# Patient Record
Sex: Female | Born: 1975 | Race: Black or African American | Hispanic: No | Marital: Married | Smoking: Never smoker
Health system: Southern US, Community
[De-identification: ages and names within clinical notes are randomized; demographics above are authoritative.]

## PROBLEM LIST (undated history)

## (undated) DIAGNOSIS — E119 Type 2 diabetes mellitus without complications: Secondary | ICD-10-CM

## (undated) DIAGNOSIS — I10 Essential (primary) hypertension: Secondary | ICD-10-CM

---

## 2013-04-15 DIAGNOSIS — I10 Essential (primary) hypertension: Secondary | ICD-10-CM | POA: Insufficient documentation

## 2013-10-10 DIAGNOSIS — R7303 Prediabetes: Secondary | ICD-10-CM | POA: Insufficient documentation

## 2013-11-30 DIAGNOSIS — K589 Irritable bowel syndrome without diarrhea: Secondary | ICD-10-CM | POA: Insufficient documentation

## 2013-11-30 DIAGNOSIS — K5909 Other constipation: Secondary | ICD-10-CM | POA: Insufficient documentation

## 2016-11-03 ENCOUNTER — Encounter (HOSPITAL_BASED_OUTPATIENT_CLINIC_OR_DEPARTMENT_OTHER): Payer: Self-pay | Admitting: *Deleted

## 2016-11-03 ENCOUNTER — Emergency Department (HOSPITAL_BASED_OUTPATIENT_CLINIC_OR_DEPARTMENT_OTHER): Payer: BLUE CROSS/BLUE SHIELD

## 2016-11-03 ENCOUNTER — Emergency Department (HOSPITAL_BASED_OUTPATIENT_CLINIC_OR_DEPARTMENT_OTHER)
Admission: EM | Admit: 2016-11-03 | Discharge: 2016-11-03 | Disposition: A | Discharge status: Home / Self Care | Payer: BLUE CROSS/BLUE SHIELD | Attending: Emergency Medicine | Admitting: Emergency Medicine

## 2016-11-03 DIAGNOSIS — R079 Chest pain, unspecified: Secondary | ICD-10-CM | POA: Insufficient documentation

## 2016-11-03 DIAGNOSIS — I10 Essential (primary) hypertension: Secondary | ICD-10-CM | POA: Diagnosis not present

## 2016-11-03 DIAGNOSIS — R200 Anesthesia of skin: Secondary | ICD-10-CM | POA: Diagnosis present

## 2016-11-03 HISTORY — DX: Essential (primary) hypertension: I10

## 2016-11-03 LAB — CBC
HEMATOCRIT: 37.9 % (ref 36.0–46.0)
Hemoglobin: 12.7 g/dL (ref 12.0–15.0)
MCH: 27.3 pg (ref 26.0–34.0)
MCHC: 33.5 g/dL (ref 30.0–36.0)
MCV: 81.3 fL (ref 78.0–100.0)
PLATELETS: 319 10*3/uL (ref 150–400)
RBC: 4.66 MIL/uL (ref 3.87–5.11)
RDW: 15.2 % (ref 11.5–15.5)
WBC: 8.5 10*3/uL (ref 4.0–10.5)

## 2016-11-03 LAB — BASIC METABOLIC PANEL
Anion gap: 8 (ref 5–15)
BUN: 9 mg/dL (ref 6–20)
CHLORIDE: 102 mmol/L (ref 101–111)
CO2: 25 mmol/L (ref 22–32)
CREATININE: 0.84 mg/dL (ref 0.44–1.00)
Calcium: 8.9 mg/dL (ref 8.9–10.3)
GFR calc Af Amer: >60 mL/min (ref >60)
GFR calc non Af Amer: >60 mL/min (ref >60)
GLUCOSE: 164 mg/dL — AB (ref 65–99)
POTASSIUM: 3.3 mmol/L — AB (ref 3.5–5.1)
SODIUM: 135 mmol/L (ref 135–145)

## 2016-11-03 LAB — TROPONIN I: Troponin I: <0.03 ng/mL (ref <0.03)

## 2016-11-03 NOTE — Discharge Instructions (Signed)
Follow up with your family doc.  °

## 2016-11-03 NOTE — ED Provider Notes (Signed)
[facility] Provider Note   CSN: [id number] Arrival date & time: 11/03/16  2121  By signing my name below, I, Vista Minkobert Ross, attest that this documentation has been prepared under the direction and in the presence of Melene Planan Revere Maahs, DO. Electronically signed, Vista Minkobert Ross, ED Scribe. 11/03/16. 9:55 PM.  History   Chief Complaint No chief complaint on file.   HPI HPI Comments: Rhonda Camacho is a 40 y.o. female, with Hx of HTN, who presents to the Emergency Department complaining of gradually improved chest pain with associated intermittent numbness to her left great toe, onset two weeks ago. Pt states she started to have chest pain two weeks ago, she described this chest pain as "it feels like gas is in my chest". Her chest pain subsided after one week and has not had an episode of chest pain since. She also has had intermittent numbness to left great toe at night. Pt reports that her BP has been elevated for the past 3 days but is not taking medication for this. BP was 223/115 on arrival. No exacerbating or alleviating factors. No shortness of breath or diaphoresis.   The history is provided by the patient. No language interpreter was used.    Past Medical History:  Diagnosis Date  . Hypertension     There are no active problems to display for this patient.   Past Surgical History:  Procedure Laterality Date  . CESAREAN SECTION      OB History    No data available       Home Medications    Prior to Admission medications   Medication Sig Start Date End Date Taking? Authorizing Provider  valsartan (DIOVAN) 40 MG tablet Take 40 mg by mouth daily.   Yes Historical Provider, MD    Family History No family history on file.  Social History Social History  Substance Use Topics  . Smoking status: Never Smoker  . Smokeless tobacco: Never Used  . Alcohol use No    Allergies   Patient has no known allergies.   Review of Systems Review of Systems    Constitutional: Negative for chills and fever.  HENT: Negative for congestion and rhinorrhea.   Eyes: Negative for redness and visual disturbance.  Respiratory: Negative for shortness of breath and wheezing.   Cardiovascular: Positive for chest pain. Negative for palpitations.  Gastrointestinal: Negative for constipation, nausea and vomiting.  Genitourinary: Negative for dysuria and urgency.  Musculoskeletal: Negative for arthralgias and myalgias.  Skin: Negative for pallor and wound.  Neurological: Positive for numbness. Negative for dizziness and headaches.  All other systems reviewed and are negative.   Physical Exam Updated Vital Signs BP (!) 223/115   Pulse 87   Temp 99.3 F (37.4 C) (Oral)   Resp 24   Ht 5\' 3"  (1.6 m)   Wt 192 lb (87.1 kg)   SpO2 100%   BMI 34.01 kg/m   Physical Exam  Constitutional: She is oriented to person, place, and time. She appears well-developed and well-nourished. No distress.  HENT:  Head: Normocephalic and atraumatic.  Eyes: EOM are normal. Pupils are equal, round, and reactive to light.  Neck: Normal range of motion. Neck supple.  Cardiovascular: Normal rate and regular rhythm.  Exam reveals no gallop and no friction rub.   No murmur heard. Pulmonary/Chest: Effort normal. She has no wheezes. She has no rales.  Abdominal: Soft. She exhibits no distension. There is no tenderness.  Musculoskeletal: She exhibits no edema or tenderness.  Neurological: She is alert and oriented to person, place, and time. No cranial nerve deficit or sensory deficit. Coordination normal.  Normal neuro exam. Coordination was deferred in LUE due to discomfort with IV.  Skin: Skin is warm and dry. She is not diaphoretic.  Psychiatric: She has a normal mood and affect. Her behavior is normal.  Nursing note and vitals reviewed.    ED Treatments / Results  DIAGNOSTIC STUDIES: Oxygen Saturation is 100% on RA, normal by my interpretation.  COORDINATION OF  CARE: 9:50 PM-Discussed treatment plan with pt at bedside and pt agreed to plan.   Labs (all labs ordered are listed, but only abnormal results are displayed) Labs Reviewed  BASIC METABOLIC PANEL  CBC  TROPONIN I   EKG  EKG Interpretation  Date/Time:  Monday November 03 2016 21:34:23 EST Ventricular Rate:  75 PR Interval:  138 QRS Duration: 82 QT Interval:  358 QTC Calculation: 399 R Axis:   74 Text Interpretation:  Normal sinus rhythm T wave abnormality, consider inferior ischemia Abnormal ECG No old tracing to compare Confirmed by Terrina Docter MD, DANIEL [phone]([id number]) on 11/03/2016 9:45:06 PM       Radiology No results found.  Procedures Procedures (including critical care time)  Medications Ordered in ED Medications - No data to display   Initial Impression / Assessment and Plan / ED Course  I have reviewed the triage vital signs and the nursing notes.  Pertinent labs & imaging results that were available during my care of the patient were reviewed by me and considered in my medical decision making (see chart for details).  Clinical Course     40 yo F With a chief complaint of high blood pressure. She has had no real symptoms with this. She states that she has had some off and on paresthesias to her left great toe. These seem to come and go without any real association. Denies any other neurologic deficits. She has had some mild headaches off and on but this been going on for many months. A week or so ago she did have some chest pain and shortness of breath that resolved spontaneously. Labs were ordered by the triage nurse and were unremarkable. Discharge home.  11:24 PM:  I have discussed the diagnosis/risks/treatment options with the patient and family and believe the pt to be eligible for discharge home to follow-up with PCP. We also discussed returning to the ED immediately if new or worsening sx occur. We discussed the sx which are most concerning (e.g., sudden worsening pain,  fever, inability to tolerate by mouth) that necessitate immediate return. Medications administered to the patient during their visit and any new prescriptions provided to the patient are listed below.  Medications given during this visit Medications - No data to display   The patient appears reasonably screen and/or stabilized for discharge and I doubt any other medical condition or other [facility] requiring further screening, evaluation, or treatment in the ED at this time prior to discharge.    Final Clinical Impressions(s) / ED Diagnoses   Final diagnoses:  None    New Prescriptions New Prescriptions   No medications on file  I personally performed the services described in this documentation, which was scribed in my presence. The recorded information has been reviewed and is accurate.     Melene Planan Walther [place], DO 11/03/16 2324

## 2016-11-03 NOTE — ED Triage Notes (Signed)
Elevated BP x 3 days that she is aware of. Left great toe has been numb on and off x 2 weeks. Constipation.

## 2016-11-06 DIAGNOSIS — Q600 Renal agenesis, unilateral: Secondary | ICD-10-CM | POA: Insufficient documentation

## 2016-11-23 DIAGNOSIS — E669 Obesity, unspecified: Secondary | ICD-10-CM | POA: Insufficient documentation

## 2017-11-27 ENCOUNTER — Other Ambulatory Visit: Payer: Self-pay

## 2017-11-27 ENCOUNTER — Emergency Department (HOSPITAL_BASED_OUTPATIENT_CLINIC_OR_DEPARTMENT_OTHER)
Admission: EM | Admit: 2017-11-27 | Discharge: 2017-11-27 | Disposition: A | Discharge status: Home / Self Care | Payer: BLUE CROSS/BLUE SHIELD | Attending: Emergency Medicine | Admitting: Emergency Medicine

## 2017-11-27 ENCOUNTER — Encounter (HOSPITAL_BASED_OUTPATIENT_CLINIC_OR_DEPARTMENT_OTHER): Payer: Self-pay | Admitting: Emergency Medicine

## 2017-11-27 DIAGNOSIS — Y998 Other external cause status: Secondary | ICD-10-CM | POA: Insufficient documentation

## 2017-11-27 DIAGNOSIS — Z79899 Other long term (current) drug therapy: Secondary | ICD-10-CM | POA: Diagnosis not present

## 2017-11-27 DIAGNOSIS — X500XXA Overexertion from strenuous movement or load, initial encounter: Secondary | ICD-10-CM | POA: Diagnosis not present

## 2017-11-27 DIAGNOSIS — R443 Hallucinations, unspecified: Secondary | ICD-10-CM | POA: Insufficient documentation

## 2017-11-27 DIAGNOSIS — S4992XA Unspecified injury of left shoulder and upper arm, initial encounter: Secondary | ICD-10-CM | POA: Diagnosis present

## 2017-11-27 DIAGNOSIS — Y9289 Other specified places as the place of occurrence of the external cause: Secondary | ICD-10-CM | POA: Diagnosis not present

## 2017-11-27 DIAGNOSIS — S46912A Strain of unspecified muscle, fascia and tendon at shoulder and upper arm level, left arm, initial encounter: Secondary | ICD-10-CM | POA: Diagnosis not present

## 2017-11-27 DIAGNOSIS — M792 Neuralgia and neuritis, unspecified: Secondary | ICD-10-CM

## 2017-11-27 DIAGNOSIS — Y93B9 Activity, other involving muscle strengthening exercises: Secondary | ICD-10-CM | POA: Insufficient documentation

## 2017-11-27 DIAGNOSIS — I10 Essential (primary) hypertension: Secondary | ICD-10-CM | POA: Insufficient documentation

## 2017-11-27 DIAGNOSIS — T148XXA Other injury of unspecified body region, initial encounter: Secondary | ICD-10-CM

## 2017-11-27 LAB — CBC WITH DIFFERENTIAL/PLATELET
Basophils Absolute: 0 10*3/uL (ref 0.0–0.1)
Basophils Relative: 0 %
EOS ABS: 0 10*3/uL (ref 0.0–0.7)
EOS PCT: 0 %
HCT: 37.6 % (ref 36.0–46.0)
Hemoglobin: 13 g/dL (ref 12.0–15.0)
LYMPHS ABS: 1.4 10*3/uL (ref 0.7–4.0)
Lymphocytes Relative: 18 %
MCH: 28 pg (ref 26.0–34.0)
MCHC: 34.6 g/dL (ref 30.0–36.0)
MCV: 80.9 fL (ref 78.0–100.0)
MONO ABS: 0.9 10*3/uL (ref 0.1–1.0)
Monocytes Relative: 11 %
Neutro Abs: 5.7 10*3/uL (ref 1.7–7.7)
Neutrophils Relative %: 71 %
PLATELETS: 419 10*3/uL — AB (ref 150–400)
RBC: 4.65 MIL/uL (ref 3.87–5.11)
RDW: 13.4 % (ref 11.5–15.5)
WBC: 8.1 10*3/uL (ref 4.0–10.5)

## 2017-11-27 LAB — COMPREHENSIVE METABOLIC PANEL
ALT: 30 U/L (ref 14–54)
AST: 30 U/L (ref 15–41)
Albumin: 4.4 g/dL (ref 3.5–5.0)
Alkaline Phosphatase: 39 U/L (ref 38–126)
Anion gap: 10 (ref 5–15)
BUN: 11 mg/dL (ref 6–20)
CHLORIDE: 99 mmol/L — AB (ref 101–111)
CO2: 24 mmol/L (ref 22–32)
CREATININE: 0.72 mg/dL (ref 0.44–1.00)
Calcium: 9.4 mg/dL (ref 8.9–10.3)
GFR calc Af Amer: >60 mL/min (ref >60)
Glucose, Bld: 123 mg/dL — ABNORMAL HIGH (ref 65–99)
Potassium: 3.1 mmol/L — ABNORMAL LOW (ref 3.5–5.1)
Sodium: 133 mmol/L — ABNORMAL LOW (ref 135–145)
Total Bilirubin: 0.5 mg/dL (ref 0.3–1.2)
Total Protein: 8.2 g/dL — ABNORMAL HIGH (ref 6.5–8.1)

## 2017-11-27 LAB — URINALYSIS, ROUTINE W REFLEX MICROSCOPIC
Glucose, UA: NEGATIVE mg/dL
KETONES UR: 40 mg/dL — AB
LEUKOCYTES UA: NEGATIVE
NITRITE: NEGATIVE
PH: 6 (ref 5.0–8.0)
Protein, ur: NEGATIVE mg/dL
Specific Gravity, Urine: >1.03 — ABNORMAL HIGH (ref 1.005–1.030)

## 2017-11-27 LAB — ETHANOL

## 2017-11-27 LAB — RAPID URINE DRUG SCREEN, HOSP PERFORMED
Amphetamines: NOT DETECTED
Barbiturates: NOT DETECTED
Benzodiazepines: NOT DETECTED
Cocaine: NOT DETECTED
OPIATES: NOT DETECTED
Tetrahydrocannabinol: NOT DETECTED

## 2017-11-27 LAB — URINALYSIS, MICROSCOPIC (REFLEX): WBC UA: NONE SEEN WBC/hpf (ref 0–5)

## 2017-11-27 LAB — PREGNANCY, URINE: PREG TEST UR: NEGATIVE

## 2017-11-27 LAB — CBG MONITORING, ED: Glucose-Capillary: 105 mg/dL — ABNORMAL HIGH (ref 65–99)

## 2017-11-27 MED ORDER — KETOROLAC TROMETHAMINE 30 MG/ML IJ SOLN
15.0000 mg | Freq: Once | INTRAMUSCULAR | Status: AC
  Administered 2017-11-27: 15 mg via INTRAMUSCULAR
  Filled 2017-11-27: qty 1

## 2017-11-27 MED ORDER — ONDANSETRON 4 MG PO TBDP
4.0000 mg | ORAL_TABLET | Freq: Once | ORAL | Status: AC
  Administered 2017-11-27: 4 mg via ORAL
  Filled 2017-11-27: qty 1

## 2017-11-27 MED ORDER — PREDNISONE 20 MG PO TABS: 40.0000 mg | ORAL_TABLET | Freq: Every day | ORAL | 0 refills | Status: DC

## 2017-11-27 NOTE — ED Triage Notes (Signed)
Patient states that she was seen and treated for HTN and muscles spasms with numbness to her left arm yesterday - reports that the medications that she was given are not helping

## 2017-11-27 NOTE — ED Notes (Signed)
The pain started on Monday after working out at the gym.  The pain is in the left shoulder radiating to her upper back.  She also c/o numbness to her left fingers.  Went to the Urgent Care at the [facility].  She was prescribed with Gabapentin, Tramadol and Baclofen without relief.

## 2017-11-27 NOTE — ED Notes (Signed)
ED Provider at bedside. 

## 2017-11-27 NOTE — ED Provider Notes (Signed)
[facility] EMERGENCY DEPARTMENT Provider Note   CSN: [id number] Arrival date & time: 11/27/17  1621     History   Chief Complaint Chief Complaint  Patient presents with  . Back Pain    HPI Rhonda Camacho is a 41 y.o. female.  HPI Patient presents with left shoulder pain going down the arm.  Has numbness in the left fingers with it.  Has had for the last 5 days.  Began after working out at [employer]  Also has pain in her shoulders that is worse with movement.  No chest pain or trouble breathing.  No weakness.  Was seen at an urgent care and given Neurontin tramadol and baclofen.  States the pain is not improved.  States she was supposed to follow-up with her primary care doctor today but felt too bad.  States she is been told what sounds like her hemoglobin A1c is elevated.  She is not a known diabetic. Past Medical History:  Diagnosis Date  . Hypertension     There are no active problems to display for this patient.   Past Surgical History:  Procedure Laterality Date  . CESAREAN SECTION      OB History    No data available       Home Medications    Prior to Admission medications   Medication Sig Start Date End Date Taking? Authorizing Provider  amLODipine (NORVASC) 5 MG tablet Take 5 mg by mouth daily.   Yes [provider]  baclofen (LIORESAL) 20 MG tablet Take 20 mg by mouth 3 (three) times daily.   Yes [provider]  benazepril-hydrochlorthiazide (LOTENSIN HCT) 10-12.5 MG tablet Take 1 tablet by mouth daily.   Yes [provider]  gabapentin (NEURONTIN) 300 MG capsule Take 300 mg by mouth 2 (two) times daily.   Yes [provider]  traMADol (ULTRAM) 50 MG tablet Take by mouth every 6 (six) hours as needed.   Yes [provider]  predniSONE (DELTASONE) 20 MG tablet Take 2 tablets (40 mg total) by mouth daily. 11/27/17   Benjiman Core, MD  valsartan (DIOVAN) 40 MG tablet Take 40 mg by mouth daily.     [provider]    Family History History reviewed. No pertinent family history.  Social History Social History   Tobacco Use  . Smoking status: Never Smoker  . Smokeless tobacco: Never Used  Substance Use Topics  . Alcohol use: No  . Drug use: Not on file     Allergies   Patient has no known allergies.   Review of Systems Review of Systems  Constitutional: Negative for appetite change.  HENT: Negative for congestion.   Respiratory: Positive for shortness of breath.   Cardiovascular: Negative for chest pain.  Gastrointestinal: Negative for abdominal pain.  Endocrine: Negative for polyphagia and polyuria.  Genitourinary: Negative for frequency.  Musculoskeletal: Positive for neck pain.  Neurological: Positive for numbness.  Hematological: Negative for adenopathy.  Psychiatric/Behavioral: Negative for confusion.     Physical Exam Updated Vital Signs BP (!) 169/131 (BP Location: Right Wrist)   Pulse 95   Temp 98.3 F (36.8 C) (Oral)   Resp 18   Ht 5\' 4"  (1.626 m)   Wt 93 kg (205 lb)   SpO2 100%   BMI 35.19 kg/m   Physical Exam  Constitutional: She appears well-developed and well-nourished.  HENT:  Head: Atraumatic.  Neck: Neck supple.  Cardiovascular: Normal rate.  Pulmonary/Chest: Effort normal.  Abdominal: There is  no tenderness.  Musculoskeletal: She exhibits tenderness.  Tenderness over musculature medial to left scapula and somewhat superior.  No rash.  No deformity.  No midline tenderness.  Neurological:  Strength intact over left hand.  Paresthesias over fingers.  Good grip strength and good radial median and ulnar strength.  Strong radial pulse.  Good flexion and extension at elbow wrist and shoulder.  Skin: Skin is warm. Capillary refill takes less than 2 seconds.     ED Treatments / Results  Labs (all labs ordered are listed, but only abnormal results are displayed) Labs Reviewed  COMPREHENSIVE METABOLIC PANEL - Abnormal; Notable  for the following components:      Result Value   Sodium 133 (*)    Potassium 3.1 (*)    Chloride 99 (*)    Glucose, Bld 123 (*)    Total Protein 8.2 (*)    All other components within normal limits  CBC WITH DIFFERENTIAL/PLATELET - Abnormal; Notable for the following components:   Platelets 419 (*)    All other components within normal limits  URINALYSIS, ROUTINE W REFLEX MICROSCOPIC - Abnormal; Notable for the following components:   Specific Gravity, Urine >1.030 (*)    Hgb urine dipstick LARGE (*)    Bilirubin Urine SMALL (*)    Ketones, ur 40 (*)    All other components within normal limits  URINALYSIS, MICROSCOPIC (REFLEX) - Abnormal; Notable for the following components:   Bacteria, UA FEW (*)    Squamous Epithelial / LPF 0-5 (*)    All other components within normal limits  CBG MONITORING, ED - Abnormal; Notable for the following components:   Glucose-Capillary 105 (*)    All other components within normal limits  ETHANOL  RAPID URINE DRUG SCREEN, HOSP PERFORMED  PREGNANCY, URINE    EKG  EKG Interpretation None       Radiology No results found.  Procedures Procedures (including critical care time)  Medications Ordered in ED Medications  ketorolac (TORADOL) 30 MG/ML injection 15 mg (15 mg Intramuscular Given 11/27/17 1843)     Initial Impression / Assessment and Plan / ED Course  I have reviewed the triage vital signs and the nursing notes.  Pertinent labs & imaging results that were available during my care of the patient were reviewed by me and considered in my medical decision making (see chart for details).    Patient with back pain since yesterday.  Left shoulder going to upper back.  Worse with movements.  Started on Ultram Neurontin and baclofen.  No relief in pain.  Given Toradol here.  Patient now has a different family member with her and states the patient is hallucinating.  Asking for a grandmother who is no longer alive.  Patient is now back  at her baseline.  States she took more of the medicine then she was supposed to because she was hurting.  Took particularly extra tramadol.  Likely was because the mental status change.  Does have some hypertension here but is Artie on antihypertensives.  Will give short course of steroids.  Discussed risk benefits with patient especially with her mildly elevated blood sugar.  Will only give a 3-day course.  Sports medicine follow-up given as needed.  Discharge home.  Patient had me dispose of her tramadol.   Final Clinical Impressions(s) / ED Diagnoses   Final diagnoses:  Muscle strain  Radicular pain in left arm    ED Discharge Orders        Ordered  predniSONE (DELTASONE) 20 MG tablet  Daily     11/27/17 2131       Benjiman Core, MD 11/27/17 2135

## 2017-11-27 NOTE — ED Notes (Signed)
Patient, pt's mother and sister at bedside while discharge instructions was given.

## 2017-12-01 ENCOUNTER — Ambulatory Visit (INDEPENDENT_AMBULATORY_CARE_PROVIDER_SITE_OTHER): Payer: BLUE CROSS/BLUE SHIELD | Admitting: Family Medicine

## 2017-12-01 ENCOUNTER — Encounter: Payer: Self-pay | Admitting: Family Medicine

## 2017-12-01 DIAGNOSIS — M79602 Pain in left arm: Secondary | ICD-10-CM

## 2017-12-01 MED ORDER — PREDNISONE 10 MG PO TABS: ORAL_TABLET | ORAL | 0 refills | Status: AC

## 2017-12-01 NOTE — Patient Instructions (Addendum)
You strained your rhomboids in your upper back. The symptoms you're experiencing are similar to carpal tunnel and are from the swelling in your wrist. This should resolve over 1-2 weeks. Take prednisone dose pack as directed for 6 days. You can take tylenol, tramadol, baclofen in addition to this if needed but the prednisone is the only one that's likely to help you recover faster. Wrist brace to help rest this for the next 2 weeks. Follow up with me at that time for reevaluation.

## 2017-12-02 ENCOUNTER — Encounter: Payer: Self-pay | Admitting: Family Medicine

## 2017-12-02 DIAGNOSIS — M79602 Pain in left arm: Secondary | ICD-10-CM | POA: Insufficient documentation

## 2017-12-02 NOTE — Assessment & Plan Note (Signed)
started with strain of rhomboids of upper back.  Current symptoms consistent with carpal tunnel related to swelling that has worked its way down the arm.  Try a longer course of prednisone for 6 days.  Tylenol, tramadol, baclofen as needed.  Wrist brace to help wrist this.  F/u in 2 weeks for reevaluation.

## 2017-12-02 NOTE — Progress Notes (Signed)
PCP: [facility]  Subjective:   HPI: Patient is a 42 y.o. female here for left shoulder pain.  Patient reports on 1/7 she worked out a lot at [employer] doing back/upper back exercises. Next day she had pain in upper posterior left shoulder and back. Over time this has improved some but feels like pain has worked its way down arm - was hurting in shoulder and elbow, now primarily with left wrist and hand pain. Associated tingling in 1st-3rd digits and sensation of cold here. Throbbing pain up to 7/10 level. She took prednisone for 3 days, IM toradol without much benefit so far. No prior issues with this. No bowel/bladder dysfunction.  Past Medical History:  Diagnosis Date  . Hypertension     Current Outpatient Medications on File Prior to Visit  Medication Sig Dispense Refill  . linaclotide (LINZESS) 72 MCG capsule Take by mouth.    Marland Kitchen amLODipine (NORVASC) 5 MG tablet Take 5 mg by mouth daily.    . baclofen (LIORESAL) 20 MG tablet Take 20 mg by mouth 3 (three) times daily.    Marland Kitchen BELVIQ XR 20 MG TB24 TK 1 T PO QD  0  . benazepril-hydrochlorthiazide (LOTENSIN HCT) 10-12.5 MG tablet Take 1 tablet by mouth daily.    . diclofenac sodium (VOLTAREN) 1 % GEL APP AA BID PRN  0  . gabapentin (NEURONTIN) 300 MG capsule Take 300 mg by mouth 2 (two) times daily.    . hydrochlorothiazide (HYDRODIURIL) 25 MG tablet TK 1 T PO D  2  . Multiple Vitamin (MULTI-VITAMINS) TABS Take by mouth.    . traMADol (ULTRAM) 50 MG tablet Take by mouth every 6 (six) hours as needed.    . valsartan (DIOVAN) 40 MG tablet Take 40 mg by mouth daily.     No current facility-administered medications on file prior to visit.     Past Surgical History:  Procedure Laterality Date  . CESAREAN SECTION      No Known Allergies  Social History   Socioeconomic History  . Marital status: Married    Spouse name: Not on file  . Number of children: Not on file  . Years of education: Not on file  . Highest  education level: Not on file  Social Needs  . Financial resource strain: Not on file  . Food insecurity - worry: Not on file  . Food insecurity - inability: Not on file  . Transportation needs - medical: Not on file  . Transportation needs - non-medical: Not on file  Occupational History  . Not on file  Tobacco Use  . Smoking status: Never Smoker  . Smokeless tobacco: Never Used  Substance and Sexual Activity  . Alcohol use: No  . Drug use: Not on file  . Sexual activity: Not on file  Other Topics Concern  . Not on file  Social History Narrative  . Not on file    History reviewed. No pertinent family history.  BP (!) 152/103   Pulse 90   Ht 5\' 4"  (1.626 m)   Wt 200 lb (90.7 kg)   BMI 34.33 kg/m   Review of Systems: See HPI above.     Objective:  Physical Exam:  Gen: NAD, comfortable in exam room  Neck: No gross deformity, swelling, bruising. TTP mildly medial to left scapula.  No midline/bony TTP. FROM without pain. BUE strength 5/5 but difficulty with grip and thumb opposition due to pain. Sensation diminished to light touch 1st and 2nd  digits. Otherwise NV intact distal BUEs.  Left shoulder: No swelling, ecchymoses.  No gross deformity. No TTP. FROM. Negative Hawkins, Neers. Negative Yergasons. Strength 5/5 with empty can and resisted internal/external rotation. Negative apprehension. NV intact distally.   Assessment & Plan:  1. Left shoulder/arm pain - started with strain of rhomboids of upper back.  Current symptoms consistent with carpal tunnel related to swelling that has worked its way down the arm.  Try a longer course of prednisone for 6 days.  Tylenol, tramadol, baclofen as needed.  Wrist brace to help wrist this.  F/u in 2 weeks for reevaluation.

## 2017-12-15 ENCOUNTER — Ambulatory Visit: Payer: BLUE CROSS/BLUE SHIELD | Admitting: Family Medicine

## 2019-11-02 ENCOUNTER — Other Ambulatory Visit: Payer: Self-pay

## 2019-11-02 ENCOUNTER — Emergency Department (HOSPITAL_BASED_OUTPATIENT_CLINIC_OR_DEPARTMENT_OTHER): Payer: BC Managed Care – PPO

## 2019-11-02 ENCOUNTER — Emergency Department (HOSPITAL_BASED_OUTPATIENT_CLINIC_OR_DEPARTMENT_OTHER)
Admission: EM | Admit: 2019-11-02 | Discharge: 2019-11-02 | Disposition: A | Discharge status: Home / Self Care | Payer: BC Managed Care – PPO | Attending: Emergency Medicine | Admitting: Emergency Medicine

## 2019-11-02 ENCOUNTER — Encounter (HOSPITAL_BASED_OUTPATIENT_CLINIC_OR_DEPARTMENT_OTHER): Payer: Self-pay

## 2019-11-02 DIAGNOSIS — E119 Type 2 diabetes mellitus without complications: Secondary | ICD-10-CM | POA: Insufficient documentation

## 2019-11-02 DIAGNOSIS — I10 Essential (primary) hypertension: Secondary | ICD-10-CM | POA: Insufficient documentation

## 2019-11-02 DIAGNOSIS — R072 Precordial pain: Secondary | ICD-10-CM

## 2019-11-02 DIAGNOSIS — R079 Chest pain, unspecified: Secondary | ICD-10-CM | POA: Diagnosis present

## 2019-11-02 HISTORY — DX: Type 2 diabetes mellitus without complications: E11.9

## 2019-11-02 LAB — CBC WITH DIFFERENTIAL/PLATELET
Abs Immature Granulocytes: 0.01 10*3/uL (ref 0.00–0.07)
Basophils Absolute: 0 10*3/uL (ref 0.0–0.1)
Basophils Relative: 1 %
Eosinophils Absolute: 0.1 10*3/uL (ref 0.0–0.5)
Eosinophils Relative: 2 %
HCT: 40.5 % (ref 36.0–46.0)
Hemoglobin: 13.1 g/dL (ref 12.0–15.0)
Immature Granulocytes: 0 %
Lymphocytes Relative: 37 %
Lymphs Abs: 2.2 10*3/uL (ref 0.7–4.0)
MCH: 27.2 pg (ref 26.0–34.0)
MCHC: 32.3 g/dL (ref 30.0–36.0)
MCV: 84.2 fL (ref 80.0–100.0)
Monocytes Absolute: 0.9 10*3/uL (ref 0.1–1.0)
Monocytes Relative: 15 %
Neutro Abs: 2.8 10*3/uL (ref 1.7–7.7)
Neutrophils Relative %: 45 %
Platelets: 371 10*3/uL (ref 150–400)
RBC: 4.81 MIL/uL (ref 3.87–5.11)
RDW: 13.8 % (ref 11.5–15.5)
WBC: 6 10*3/uL (ref 4.0–10.5)
nRBC: 0 % (ref 0.0–0.2)

## 2019-11-02 LAB — PREGNANCY, URINE: Preg Test, Ur: NEGATIVE

## 2019-11-02 LAB — BASIC METABOLIC PANEL
Anion gap: 9 (ref 5–15)
BUN: 13 mg/dL (ref 6–20)
CO2: 28 mmol/L (ref 22–32)
Calcium: 9.5 mg/dL (ref 8.9–10.3)
Chloride: 99 mmol/L (ref 98–111)
Creatinine, Ser: 0.82 mg/dL (ref 0.44–1.00)
GFR calc Af Amer: >60 mL/min (ref >60)
GFR calc non Af Amer: >60 mL/min (ref >60)
Glucose, Bld: 133 mg/dL — ABNORMAL HIGH (ref 70–99)
Potassium: 3.1 mmol/L — ABNORMAL LOW (ref 3.5–5.1)
Sodium: 136 mmol/L (ref 135–145)

## 2019-11-02 LAB — TROPONIN I (HIGH SENSITIVITY): Troponin I (High Sensitivity): 7 ng/L (ref <18)

## 2019-11-02 LAB — D-DIMER, QUANTITATIVE: D-Dimer, Quant: <0.27 ug/mL-FEU (ref 0.00–0.50)

## 2019-11-02 MED ORDER — DICLOFENAC SODIUM 1 % EX GEL: 2.0000 g | Freq: Four times a day (QID) | CUTANEOUS | 0 refills | Status: AC | PRN

## 2019-11-02 MED ORDER — KETOROLAC TROMETHAMINE 30 MG/ML IJ SOLN
30.0000 mg | Freq: Once | INTRAMUSCULAR | Status: AC
  Administered 2019-11-02: 30 mg via INTRAVENOUS
  Filled 2019-11-02: qty 1

## 2019-11-02 NOTE — ED Provider Notes (Signed)
Emergency Department Provider Note   I have reviewed the triage vital signs and the nursing notes.   HISTORY  Chief Complaint Chest Pain   HPI Rhonda Camacho is a 43 y.o. female with PMH of HTN and DM presents to the emergency department for evaluation of left-sided chest pain.  She describes pain starting last night in the left side of her chest.  The pain is sharp and occasionally radiating to her back.  She denies shortness of breath or pleuritic pain. No fever. No URI symptoms. No history of DVT/PE. No ACS history. No sick contacts.   Past Medical History:  Diagnosis Date   Diabetes mellitus without complication ([place])    Hypertension     Patient Active Problem List   Diagnosis Date Noted   Left arm pain 12/02/2017   Obesity (BMI 30-39.9) 11/23/2016   Congenital absence of left kidney 11/06/2016   Other constipation 11/30/2013   IBS (irritable bowel syndrome) 11/30/2013   Prediabetes 10/10/2013   Benign essential hypertension 04/15/2013    Past Surgical History:  Procedure Laterality Date   CESAREAN SECTION      Allergies Patient has no known allergies.  History reviewed. No pertinent family history.  Social History Social History   Tobacco Use   Smoking status: Never Smoker   Smokeless tobacco: Never Used  Substance Use Topics   Alcohol use: No   Drug use: Not Currently    Review of Systems  Constitutional: No fever/chills Eyes: No visual changes. ENT: No sore throat. Cardiovascular: Positive chest pain. Respiratory: Denies shortness of breath. Gastrointestinal: No abdominal pain.  No nausea, no vomiting.  No diarrhea.  No constipation. Genitourinary: Negative for dysuria. Musculoskeletal: Negative for back pain. Skin: Negative for rash. Neurological: Negative for headaches, focal weakness or numbness.  10-point ROS otherwise negative.  ____________________________________________   PHYSICAL EXAM:  VITAL SIGNS: ED Triage  Vitals  Enc Vitals Group     BP 11/02/19 0824 (!) 146/101     Pulse Rate 11/02/19 0824 88     Resp 11/02/19 0824 18     Temp 11/02/19 0824 98.1 F (36.7 C)     Temp Source 11/02/19 0824 Oral     SpO2 11/02/19 0824 99 %     Weight 11/02/19 0822 198 lb (89.8 kg)     Height 11/02/19 0822 5\' 4"  (1.626 m)   Constitutional: Alert and oriented. Well appearing and in no acute distress. Eyes: Conjunctivae are normal.  Head: Atraumatic. Nose: No congestion/rhinnorhea. Mouth/Throat: Mucous membranes are moist. Neck: No stridor.   Cardiovascular: Normal rate, regular rhythm. Good peripheral circulation. Grossly normal heart sounds.   Respiratory: Normal respiratory effort.  No retractions. Lungs CTAB. Gastrointestinal: No distention.  Musculoskeletal: No lower extremity tenderness nor edema.  Neurologic:  Normal speech and language.  Skin:  Skin is warm, dry and intact. No rash noted.   ____________________________________________   LABS (all labs ordered are listed, but only abnormal results are displayed)  Labs Reviewed  BASIC METABOLIC PANEL - Abnormal; Notable for the following components:      Result Value   Potassium 3.1 (*)    Glucose, Bld 133 (*)    All other components within normal limits  CBC WITH DIFFERENTIAL/PLATELET  D-DIMER, QUANTITATIVE (NOT AT [facility])  PREGNANCY, URINE  TROPONIN I (HIGH SENSITIVITY)   ____________________________________________  EKG   EKG Interpretation  Date/Time:  Wednesday November 02 2019 08:23:57 EST Ventricular Rate:  87 PR Interval:    QRS Duration: 82 QT  Interval:  354 QTC Calculation: 426 R Axis:   73 Text Interpretation: Sinus rhythm Right atrial enlargement Borderline repolarization abnormality Baseline wander in lead(s) V6 Similar to 2017 tracing. No STEMI Confirmed by Alona Bene [phone]) on 11/02/2019 8:27:06 AM       ____________________________________________  RADIOLOGY  DG Chest 2 View  Result Date:  11/02/2019 CLINICAL DATA:  Chest pain EXAM: CHEST - 2 VIEW COMPARISON:  11/03/2016 FINDINGS: The heart size and mediastinal contours are within normal limits. Both lungs are clear. The visualized skeletal structures are unremarkable. IMPRESSION: No acute abnormality of the lungs. Electronically Signed   By: Lauralyn Primes M.D.   On: 11/02/2019 09:28    ____________________________________________   PROCEDURES  Procedure(s) performed:   Procedures  None  ____________________________________________   INITIAL IMPRESSION / ASSESSMENT AND PLAN / ED COURSE  Pertinent labs & imaging results that were available during my care of the patient were reviewed by me and considered in my medical decision making (see chart for details).   Patient presents to the emergency department for evaluation of atypical left-sided chest pain.  Patient does have several risk factors for ACS but overall low concern for this.  She has some T wave inversions inferiorly on her EKG which were also seen in 2017.  Plan for lab work, x-ray, D-dimer, and reassess.   09:48 AM  Labs and imaging reviewed. No acute findings. Pain is very atypical and ongoing since last night. No plan for trending at this time. Discussed antiinflammatory meds at home and close PCP follow up.  ____________________________________________  FINAL CLINICAL IMPRESSION(S) / ED DIAGNOSES  Final diagnoses:  Precordial chest pain     MEDICATIONS GIVEN DURING THIS VISIT:  Medications  ketorolac (TORADOL) 30 MG/ML injection 30 mg (30 mg Intravenous Given 11/02/19 0845)     NEW OUTPATIENT MEDICATIONS STARTED DURING THIS VISIT:  New Prescriptions   DICLOFENAC SODIUM (VOLTAREN) 1 % GEL    Apply 2 g topically 4 (four) times daily as needed.    Note:  This document was prepared using Dragon voice recognition software and may include unintentional dictation errors.  Alona Bene, MD, [facility] Emergency Medicine    [place] Jon, Arlyss Repress, MD 11/02/19  [phone]

## 2019-11-02 NOTE — ED Notes (Signed)
Lab tube recollected per lab request

## 2019-11-02 NOTE — Discharge Instructions (Signed)
You were seen in the ED today with chest pain. We did not find evidence of heart attack or pulmonary embolism. I have called in a prescription for muscle strain type medication that you can pick up at your pharmacy. Call your PCP to discuss further testing as needed. Return to the ED with any new or worsening symptoms.

## 2019-11-02 NOTE — ED Triage Notes (Signed)
Pt reports chest pain, left side, into back since last night, states b/p has been elevated, took amlodipine this morning, also takes hctz which she took last yesterday.  Denies cough, fever,  Denies n/v/d

## 2019-11-02 NOTE — ED Notes (Signed)
Patient transported to X-ray 

## 2019-11-02 NOTE — ED Notes (Signed)
Ambulated to bathroom with steady gait

## 2021-03-09 IMAGING — CR DG CHEST 2V
2 series · 2 of 2 positions shown · non-contrast
Comparison: 11/03/2016

CLINICAL DATA: Chest pain

EXAM:
CHEST - 2 VIEW

[w chest pa]
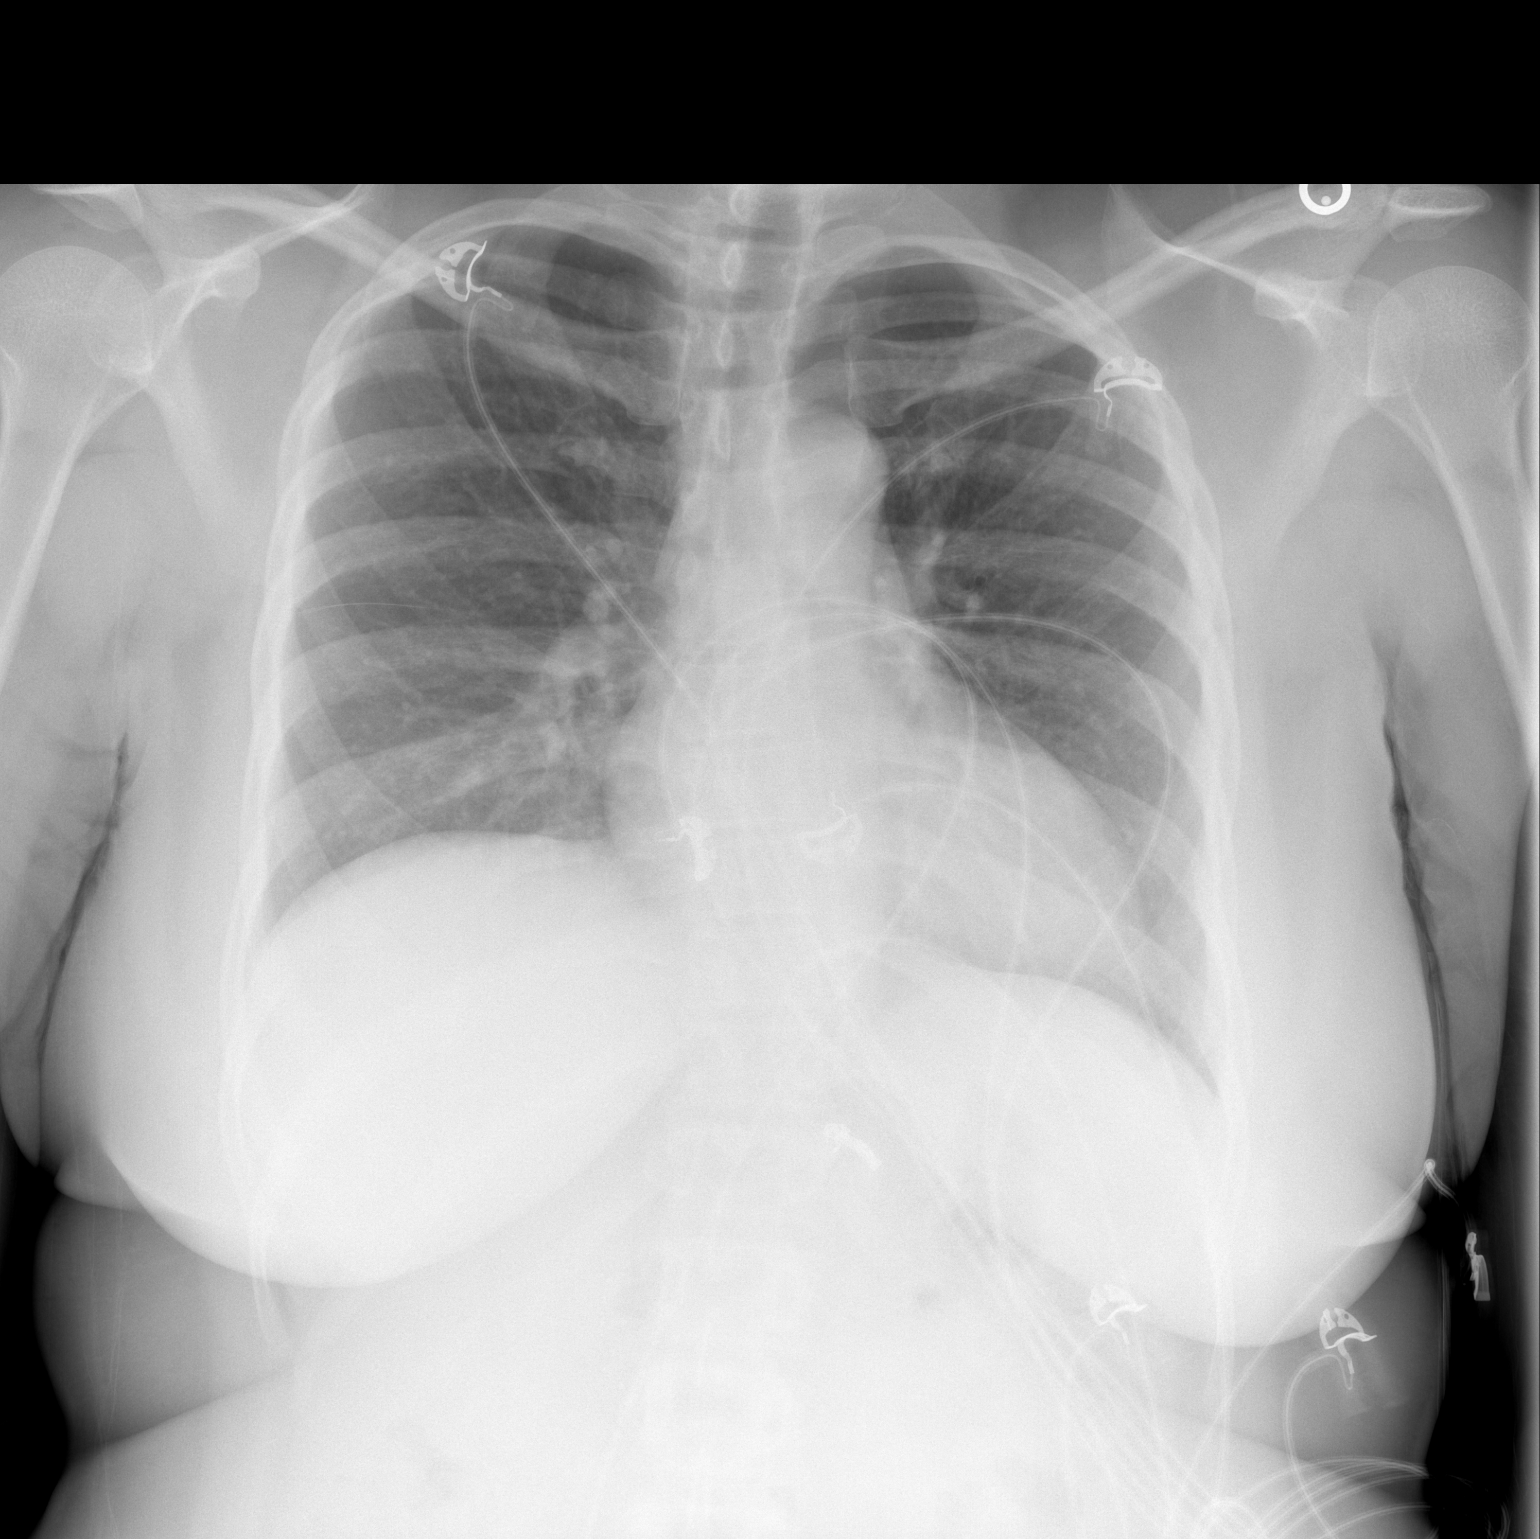

[w chest lat]
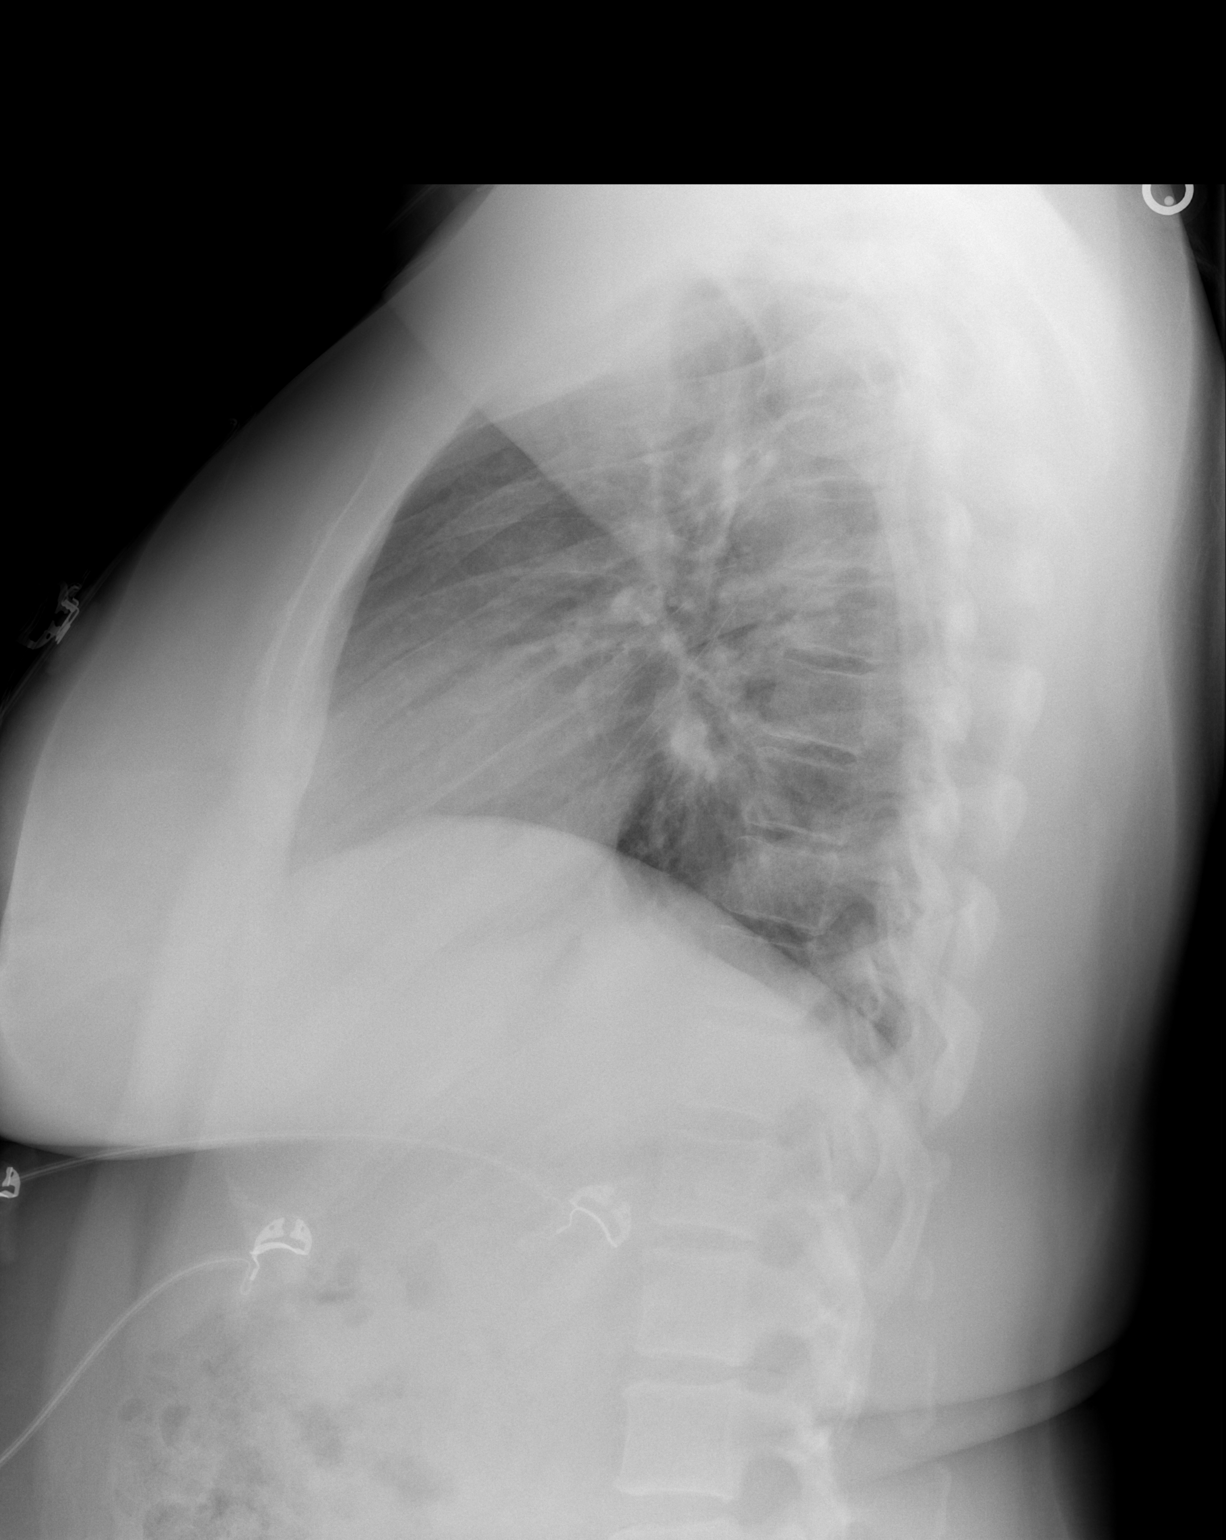

[2 of 2 positions shown; findings below may reference images not displayed]

FINDINGS: The heart size and mediastinal contours are within normal limits.
Both lungs are clear. The visualized skeletal structures are
unremarkable.
IMPRESSION: No acute abnormality of the lungs.

## 2022-07-13 ENCOUNTER — Encounter (HOSPITAL_BASED_OUTPATIENT_CLINIC_OR_DEPARTMENT_OTHER): Payer: Self-pay | Admitting: Emergency Medicine

## 2022-07-13 ENCOUNTER — Other Ambulatory Visit: Payer: Self-pay

## 2022-07-13 ENCOUNTER — Emergency Department (HOSPITAL_BASED_OUTPATIENT_CLINIC_OR_DEPARTMENT_OTHER)
Admission: EM | Admit: 2022-07-13 | Discharge: 2022-07-13 | Disposition: A | Discharge status: Home / Self Care | Payer: Commercial Managed Care - PPO | Attending: Emergency Medicine | Admitting: Emergency Medicine

## 2022-07-13 DIAGNOSIS — R519 Headache, unspecified: Secondary | ICD-10-CM | POA: Diagnosis present

## 2022-07-13 DIAGNOSIS — M542 Cervicalgia: Secondary | ICD-10-CM | POA: Insufficient documentation

## 2022-07-13 DIAGNOSIS — Z79899 Other long term (current) drug therapy: Secondary | ICD-10-CM | POA: Diagnosis not present

## 2022-07-13 DIAGNOSIS — I10 Essential (primary) hypertension: Secondary | ICD-10-CM | POA: Diagnosis not present

## 2022-07-13 DIAGNOSIS — K115 Sialolithiasis: Secondary | ICD-10-CM

## 2022-07-13 NOTE — Discharge Instructions (Addendum)
Sour candies tend to help with this.  I have given you info to follow up with ENT if this persists then you may want to see them in the office.

## 2022-07-13 NOTE — ED Triage Notes (Signed)
Pt arrives pov, steady gait, c/o HA, left side neck swelling with left ear pain when eating, and hypertension. Pt denies CP

## 2022-07-13 NOTE — ED Provider Notes (Signed)
[facility] EMERGENCY DEPARTMENT Provider Note   CSN: [id number] Arrival date & time: 07/13/22  4627     History  Chief Complaint  Patient presents with   Hypertension    Rhonda Camacho is a 45 y.o. female.  46 yo F with a chief complaints of left-sided neck pain.  This is just underneath the jaw.  Seems occur when she is something eat or drink.  Going on for about a week now.  No fevers or chills.  No difficulty swallowing.  Feels like it radiates into her ear at times.  Is also had some mild headaches with this.  Denies chest pain denies difficulty breathing.  She has a history of hypertension and has been checking her blood pressure with this problem and noted that its been elevated.  Denies one-sided numbness or weakness denies difficulty with speech or swallowing.   Hypertension       Home Medications Prior to Admission medications   Medication Sig Start Date End Date Taking? Authorizing Provider  amLODipine (NORVASC) 5 MG tablet Take 5 mg by mouth daily.    [provider]  baclofen (LIORESAL) 20 MG tablet Take 20 mg by mouth 3 (three) times daily.    [provider]  BELVIQ XR 20 MG TB24 TK 1 T PO QD 10/14/17   [provider]  benazepril-hydrochlorthiazide (LOTENSIN HCT) 10-12.5 MG tablet Take 1 tablet by mouth daily.    [provider]  diclofenac Sodium (VOLTAREN) 1 % GEL Apply 2 g topically 4 (four) times daily as needed. 11/02/19   Long, Arlyss Repress, MD  gabapentin (NEURONTIN) 300 MG capsule Take 300 mg by mouth 2 (two) times daily.    [provider]  hydrochlorothiazide (HYDRODIURIL) 25 MG tablet TK 1 T PO D 11/19/17   [provider]  linaclotide (LINZESS) 72 MCG capsule Take by mouth. 12/18/16   [provider]  metFORMIN (GLUCOPHAGE) 500 MG tablet Take by mouth 2 (two) times daily with a meal.    [provider]  Multiple Vitamin (MULTI-VITAMINS) TABS Take by mouth.    [provider]  predniSONE (DELTASONE) 10 MG tablet 6 tabs po day 1, 5 tabs po day 2, 4 tabs po day 3, 3 tabs po day 4, 2 tabs po day 5, 1 tab po day 6 12/01/17   Hudnall, Azucena Fallen, MD  traMADol (ULTRAM) 50 MG tablet Take by mouth every 6 (six) hours as needed.    [provider]  valsartan (DIOVAN) 40 MG tablet Take 40 mg by mouth daily.    [provider]      Allergies    Patient has no known allergies.    Review of Systems   Review of Systems  Physical Exam Updated Vital Signs BP (!) 191/122   Pulse 80   Temp 98.2 F (36.8 C) (Oral)   Resp 18   Ht 5\' 4"  (1.626 m)   Wt 90.7 kg   LMP 06/29/2022   SpO2 100%   BMI 34.33 kg/m  Physical Exam Vitals and nursing note reviewed.  Constitutional:      General: She is not in acute distress.    Appearance: She is well-developed. She is not diaphoretic.  HENT:     Head: Normocephalic and atraumatic.      Comments: Mild fullness underneath the left jaw.  No erythema.  No sublingual swelling.  Uvula is midline.  Tolerating secretions without difficulty.  Left TM without effusion bulging or  distortion of landmarks. Eyes:     Pupils: Pupils are equal, round, and reactive to light.  Cardiovascular:     Rate and Rhythm: Normal rate and regular rhythm.     Heart sounds: No murmur heard.    No friction rub. No gallop.  Pulmonary:     Effort: Pulmonary effort is normal.     Breath sounds: No wheezing or rales.  Abdominal:     General: There is no distension.     Palpations: Abdomen is soft.     Tenderness: There is no abdominal tenderness.  Musculoskeletal:        General: No tenderness.     Cervical back: Normal range of motion and neck supple.  Skin:    General: Skin is warm and dry.  Neurological:     Mental Status: She is alert and oriented to person, place, and time.  Psychiatric:        Behavior: Behavior normal.     ED Results / Procedures / Treatments   Labs (all labs ordered are listed, but only  abnormal results are displayed) Labs Reviewed - No data to display  EKG None  Radiology No results found.  Procedures Procedures    Medications Ordered in ED Medications - No data to display  ED Course/ Medical Decision Making/ A&P                           Medical Decision Making  46 yo F with a chief complaint of intermittent swelling underneath the left jaw.  Going on for about a week now.  Mostly the patient has a salivary duct stone.  She is well-appearing nontoxic.  No signs or symptoms consistent with infection.  She is also concerned about her blood pressure.  Is and admits been elevated while she has been worried about this pain.    Patient asymptomatic with no noted s/s of end organ damage.  No chest pain, diaphoresis, nausea or other acs symptoms.  No headache or neurologic complaints,  no unequal pulses, normal pulse ox without rales or sob.  Feel this is unlikely to be a Hypertensive Emergency and recent studies suggest no benefit for inpatient admission.  There are also no studies to my knowledge suggesting that patients with hypertensive urgency have increased risk for end organ disease. In fact there has been a study recently that would suggest that the rapid change can induce harm.  The [employer] policy statement on asymptomatic hypertension does not  recommend routing ED medical intervention. The patient will follow up closely with their PCP.  Compliance with their medication stressed.    Chester Holstein, Christell Constant EH, et al. Characteristics and outcomes of patients presenting with hypertensive urgency in the office setting. JAMA Intern Med. 2016 Jul 1; 176(7): 981-8.   Cerebrovascular risks with rapid blood pressure lowering in the absence of hypertensive emergency Laverle Hobby, Ronalee Red, et al. Am J Emerg Med. 2019;37(6):1073-1077.  7:29 AM:  I have discussed the diagnosis/risks/treatment options with the patient.  Evaluation and  diagnostic testing in the emergency department does not suggest an emergent condition requiring admission or immediate intervention beyond what has been performed at this time.  They will follow up with  PCP. We also discussed returning to the ED immediately if new or worsening sx occur. We discussed the sx which are most concerning (e.g., sudden worsening pain, fever, inability to tolerate by mouth)  that necessitate immediate return. Medications administered to the patient during their visit and any new prescriptions provided to the patient are listed below.  Medications given during this visit Medications - No data to display   The patient appears reasonably screen and/or stabilized for discharge and I doubt any other medical condition or other [facility] requiring further screening, evaluation, or treatment in the ED at this time prior to discharge.           Final Clinical Impression(s) / ED Diagnoses Final diagnoses:  Salivary duct stone    Rx / DC Orders ED Discharge Orders     None         Melene Plan, DO 07/13/22 [id number]

## 2022-07-13 NOTE — ED Notes (Signed)
Pt discharged to home. Discharge instructions have been discussed with patient and/or family members. Pt verbally acknowledges understanding d/c instructions, and endorses comprehension to checkout at registration before leaving.  °
